# Patient Record
Sex: Female | Born: 1935 | Race: White | Hispanic: No | Marital: Married
Health system: Southern US, Community
[De-identification: ages and names within clinical notes are randomized; demographics above are authoritative.]

---

## 2003-09-30 ENCOUNTER — Other Ambulatory Visit: Payer: Self-pay

## 2004-11-17 ENCOUNTER — Ambulatory Visit: Payer: Self-pay | Admitting: Internal Medicine

## 2005-01-11 ENCOUNTER — Emergency Department: Payer: Self-pay | Admitting: Emergency Medicine

## 2005-01-18 ENCOUNTER — Ambulatory Visit: Payer: Self-pay | Admitting: Internal Medicine

## 2005-01-25 ENCOUNTER — Ambulatory Visit: Payer: Self-pay | Admitting: Internal Medicine

## 2005-05-31 ENCOUNTER — Encounter: Payer: Self-pay | Admitting: Specialist

## 2005-06-10 ENCOUNTER — Encounter: Payer: Self-pay | Admitting: Specialist

## 2005-07-10 ENCOUNTER — Encounter: Payer: Self-pay | Admitting: Specialist

## 2005-11-19 ENCOUNTER — Ambulatory Visit: Payer: Self-pay | Admitting: Internal Medicine

## 2006-07-01 ENCOUNTER — Ambulatory Visit: Payer: Self-pay | Admitting: Ophthalmology

## 2006-07-08 ENCOUNTER — Ambulatory Visit: Payer: Self-pay | Admitting: Ophthalmology

## 2006-08-29 IMAGING — CT CT PELVIS W/O CM
1 series · 15 of 32 positions shown, 19 images · non-contrast
Comparison: none

REASON FOR EXAM: Increased tracer activity right sacroiliac joint
COMMENTS:

[Series 3: inspace · axial · 0.66mm/px · z∈[+405,+604]mm · 15 of 441 slices shown, 19 images]
[im 29/441  soft-tissue]
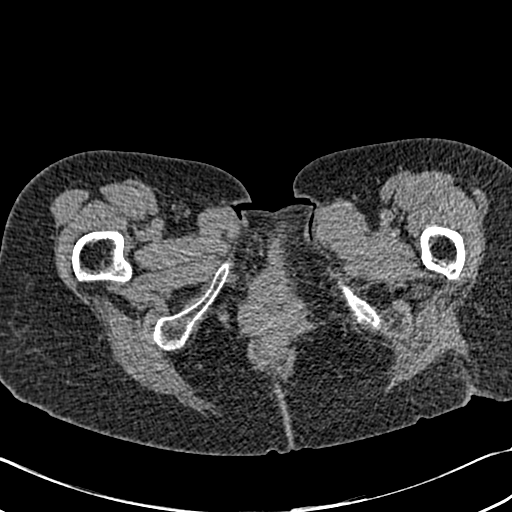
[im 29/441  bone]
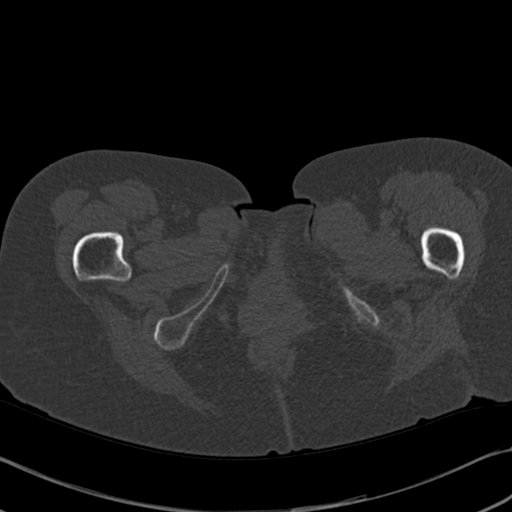
[im 57/441  soft-tissue]
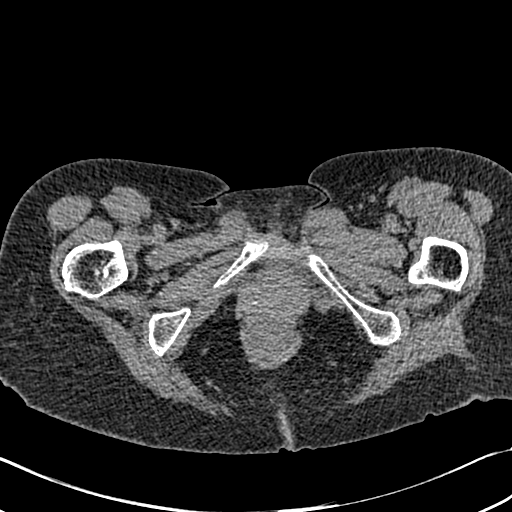
[im 86/441  soft-tissue]
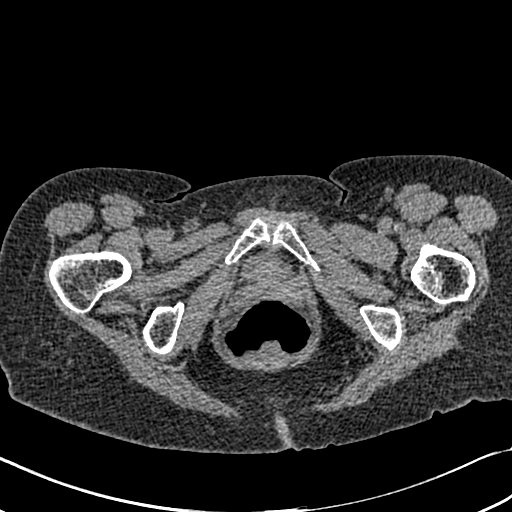
[im 128/441  soft-tissue]
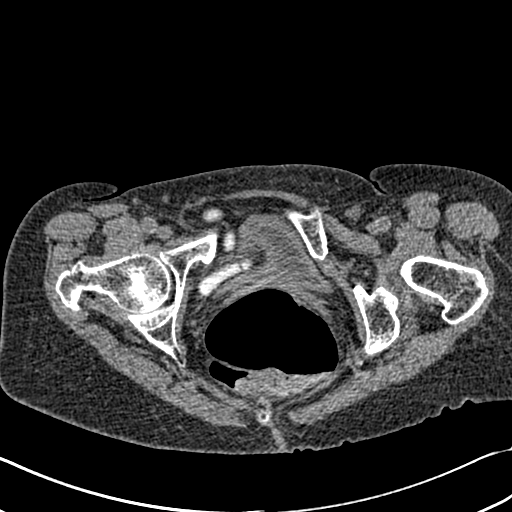
[im 157/441  soft-tissue]
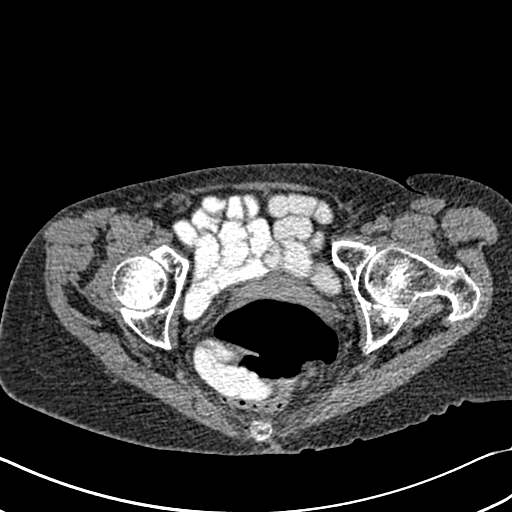
[im 185/441  soft-tissue]
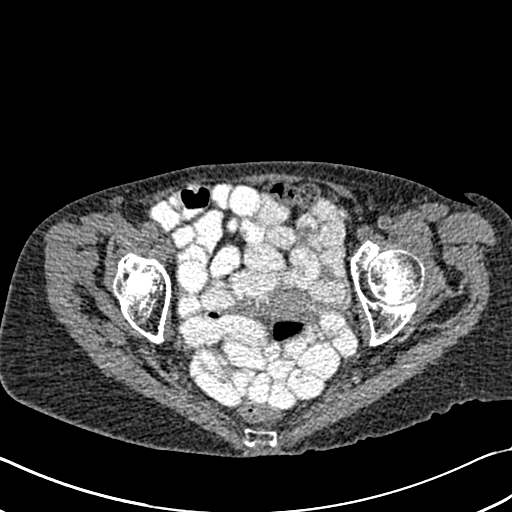
[im 228/441  soft-tissue]
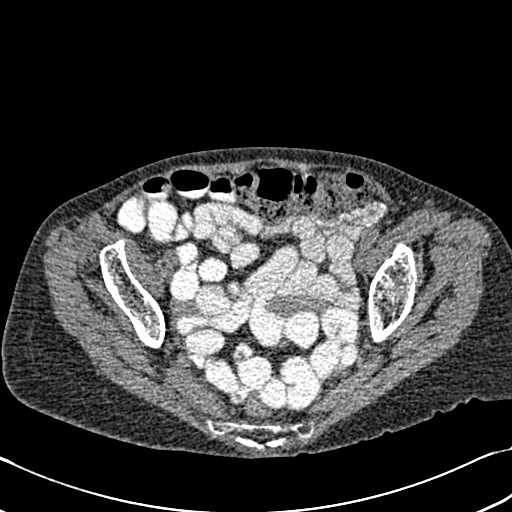
[im 256/441  soft-tissue]
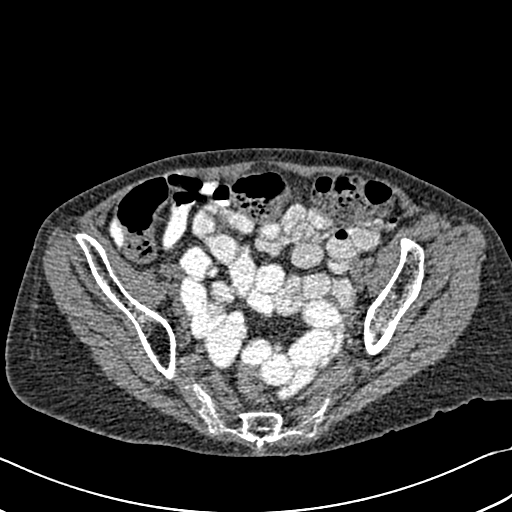
[im 284/441  soft-tissue]
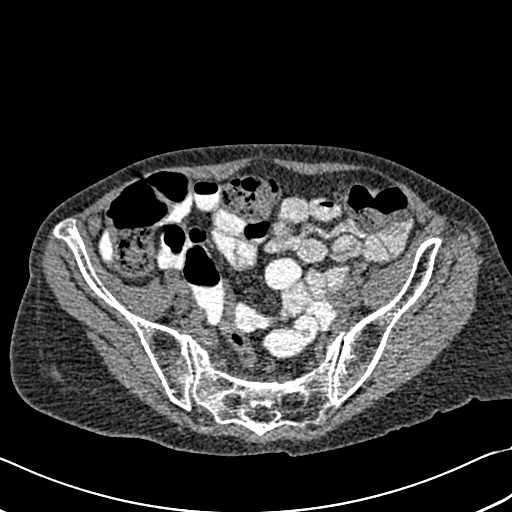
[im 284/441  bone]
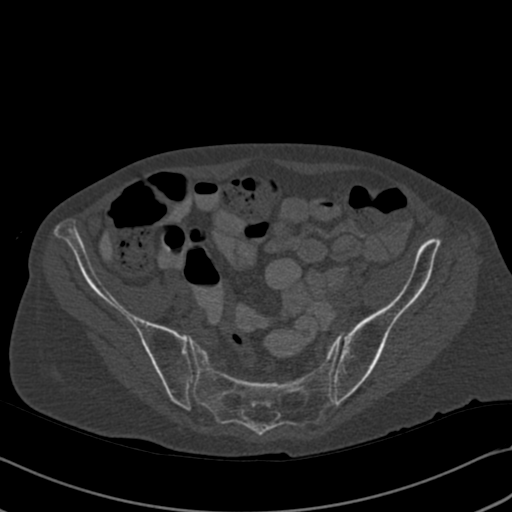
[im 313/441  soft-tissue]
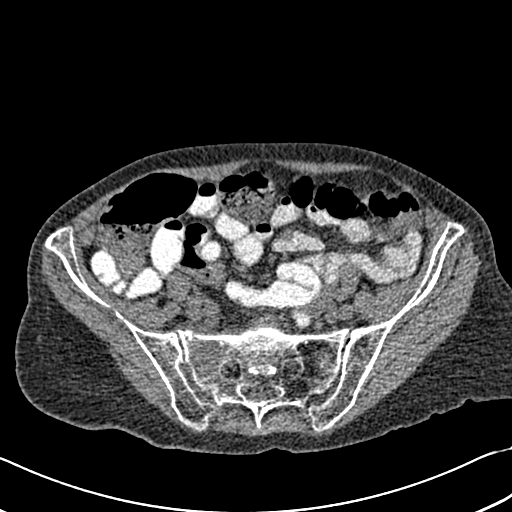
[im 355/441  soft-tissue]
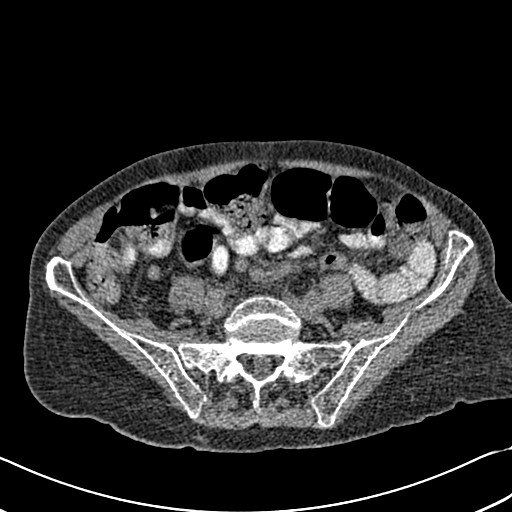
[im 384/441  soft-tissue]
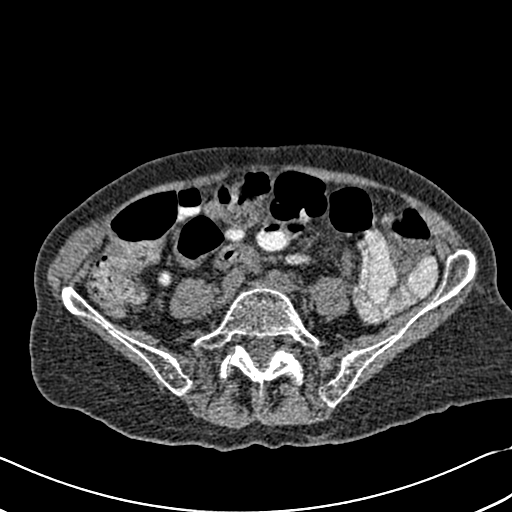
[im 384/441  lung]
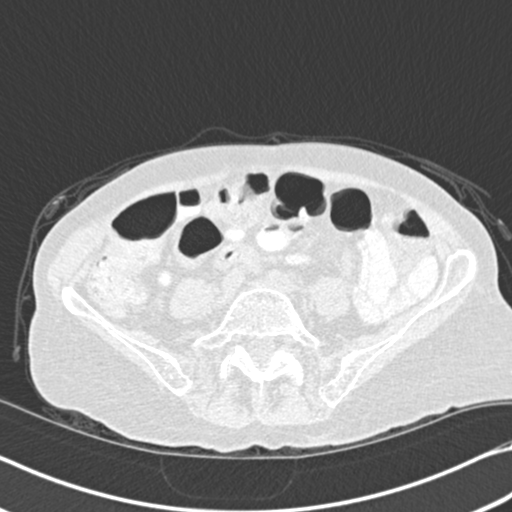
[im 398/441  lung]
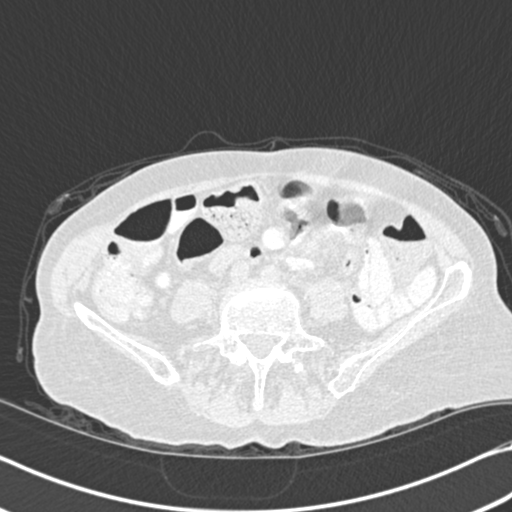
[im 412/441  soft-tissue]
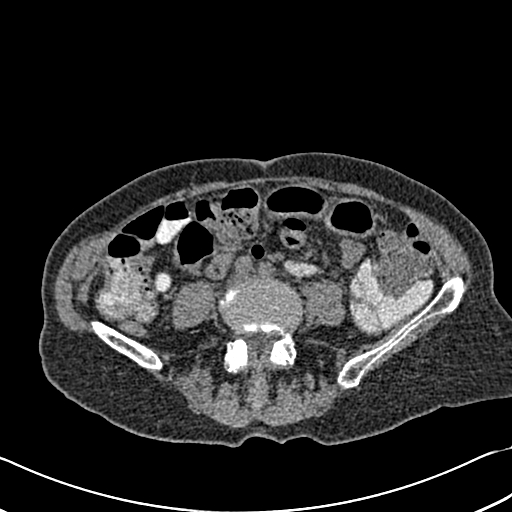
[im 412/441  lung]
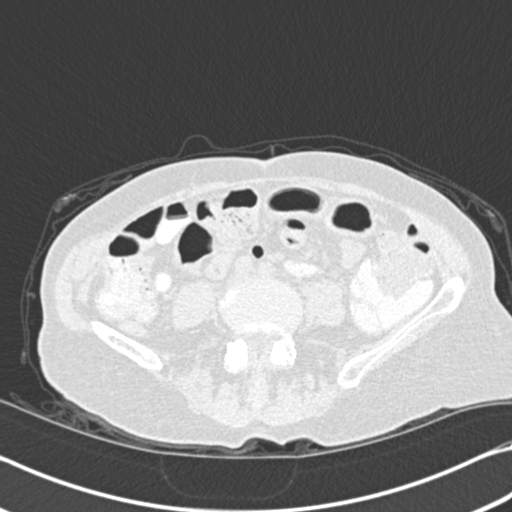
[im 426/441  lung]
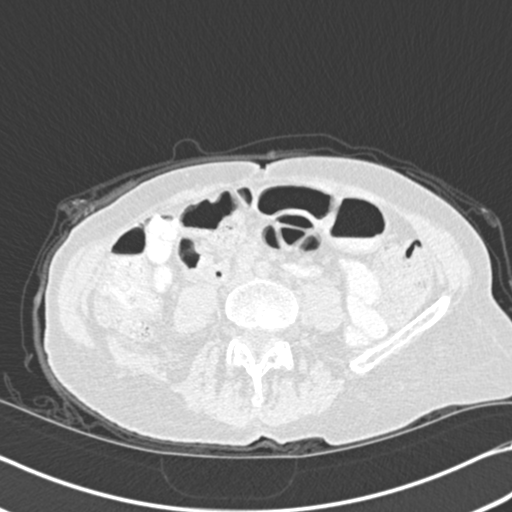

[15 of 32 positions shown; findings below may reference images not displayed]

PROCEDURE:     CT  - CT PELVIS STANDARD WO  - January 25, 2005  [DATE]

RESULT:     The patient underwent recent Nuclear Bone Scan which revealed
abnormally increased uptake in the RIGHT SI joint.

Multiplanar images were interpreted.

There is a fracture through the periphery of the RIGHT sacral ala. Some
comminution is seen. The fracture extends from the superior surface of the
sacral ala inferiorly and medially. The LEFT sacral ala appears intact. The
body of the sacrum is grossly intact. The superior and inferior pubic rami
appear intact.

The observed portions of the hips are normal in appearance. I do not see
significant hematoma adjacent to the sacral alar fracture on the RIGHT.
There are mild degenerative facet joint changes in the visualized portions
of the lower lumbar spine.
IMPRESSION: 1.     There is evidence of a fracture involving the periphery of the RIGHT
sacral ala. This fracture line likely extends at least in part to the SI
joint. The adjacent iliac portion of the SI joint appears normal. I do not
see evidence of a fracture involving the LEFT sacral ala nor is there a
fracture visible elsewhere in the bony pelvic ring.
2.     There are mild degenerative changes of the facet joints in the lower
lumbar spine.
3.     There is evidence of osteopenia diffusely.
4.     Please note there is very subtle heterogeneous mineralization and
slight irregularity of the cortical surface with some sclerosis of the
underlying medullary bone in the paramedian portion of the RIGHT pubic bone
near the symphysis. This may reflect a second component of this unexpected
pelvic fracture. This area could not be clearly discerned as abnormal on the
prior Nuclear Bone Scan.

## 2006-11-26 ENCOUNTER — Ambulatory Visit: Payer: Self-pay | Admitting: Internal Medicine

## 2007-10-09 ENCOUNTER — Other Ambulatory Visit: Payer: Self-pay

## 2007-10-09 ENCOUNTER — Inpatient Hospital Stay: Payer: Self-pay | Admitting: Internal Medicine

## 2007-12-18 ENCOUNTER — Ambulatory Visit: Payer: Self-pay | Admitting: Internal Medicine

## 2007-12-30 ENCOUNTER — Ambulatory Visit: Payer: Self-pay | Admitting: Internal Medicine

## 2008-01-11 ENCOUNTER — Emergency Department: Payer: Self-pay | Admitting: Emergency Medicine

## 2008-07-14 ENCOUNTER — Emergency Department: Payer: Self-pay | Admitting: Emergency Medicine

## 2008-09-09 ENCOUNTER — Ambulatory Visit: Payer: Self-pay | Admitting: Internal Medicine

## 2009-01-04 ENCOUNTER — Ambulatory Visit: Payer: Self-pay | Admitting: Internal Medicine

## 2009-01-12 ENCOUNTER — Ambulatory Visit: Payer: Self-pay | Admitting: Internal Medicine

## 2009-08-02 IMAGING — US ABDOMEN ULTRASOUND
1 series · 17 of 25 positions shown · non-contrast
Comparison: none

REASON FOR EXAM: ascites
COMMENTS:

[Series 1: abdomen ultrasound · 17 of 61 slices shown]
[im 1/61]
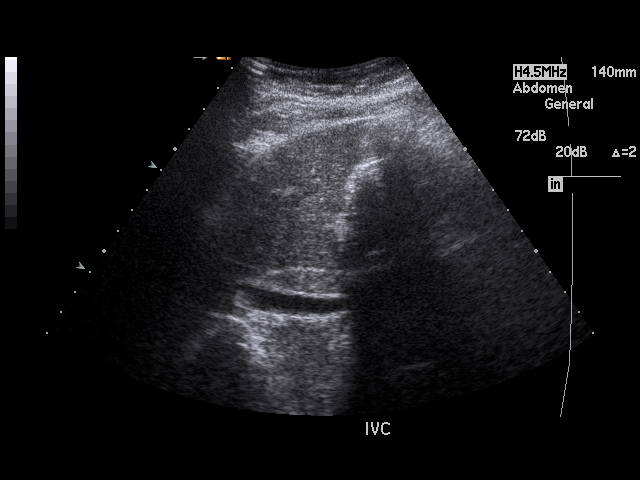
[im 6/61]
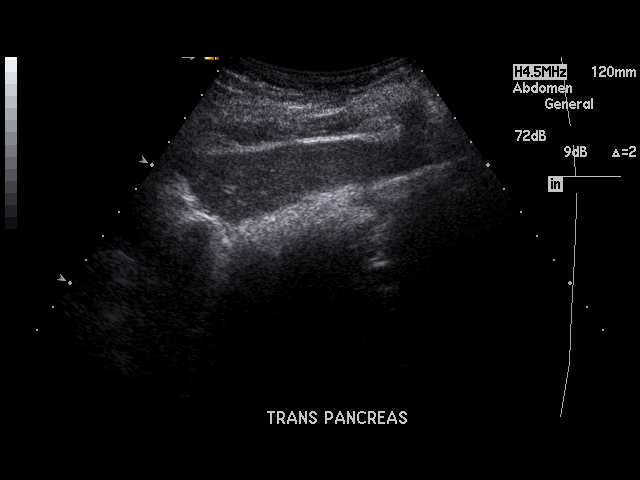
[im 8/61]
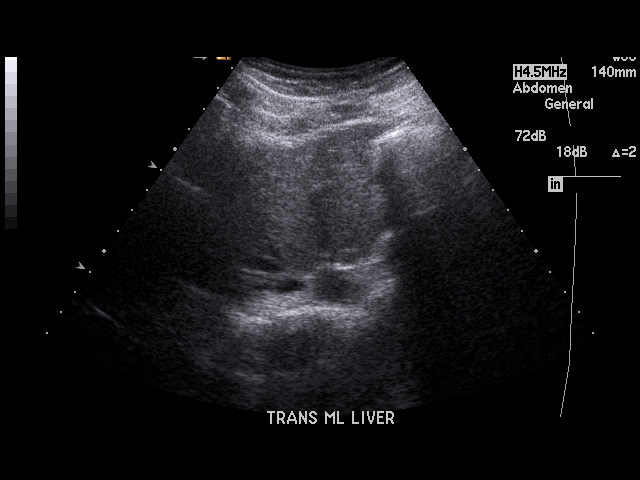
[im 13/61]
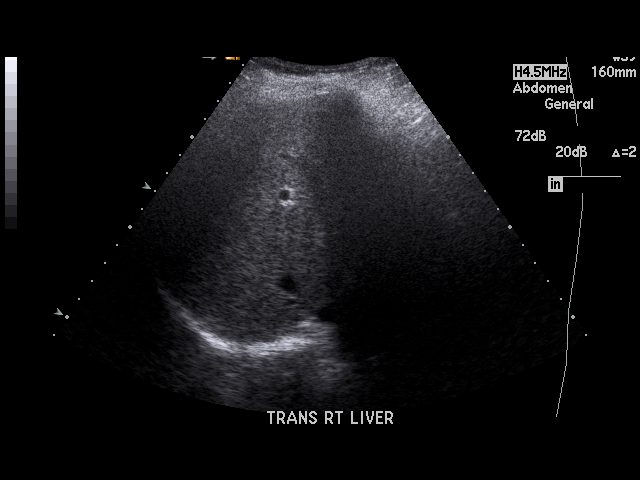
[im 16/61]
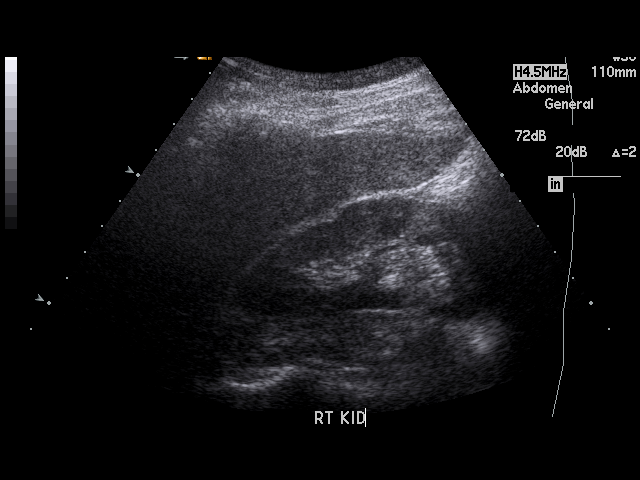
[im 21/61]
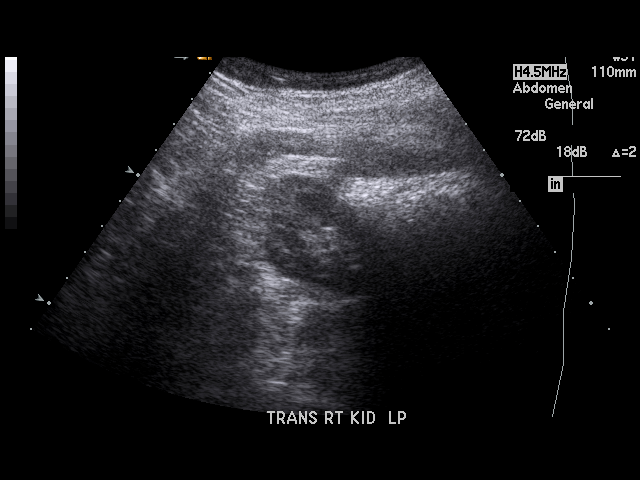
[im 23/61]
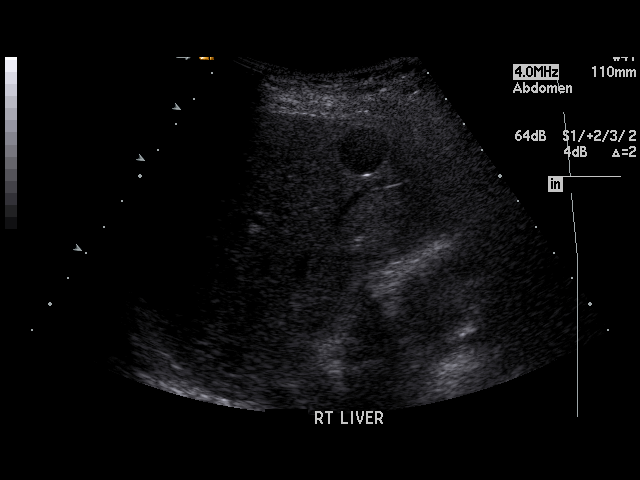
[im 28/61]
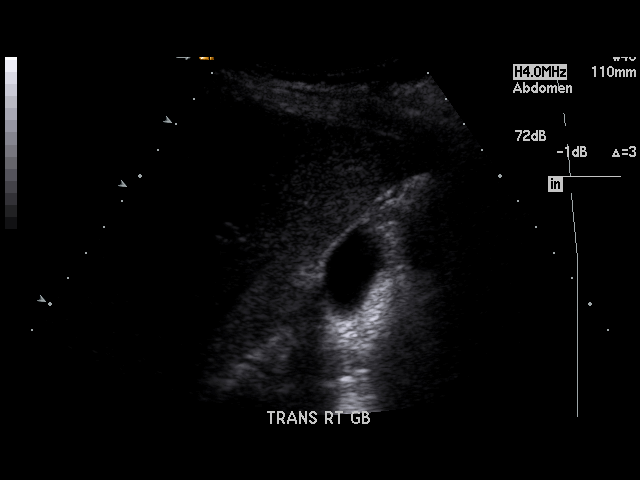
[im 31/61]
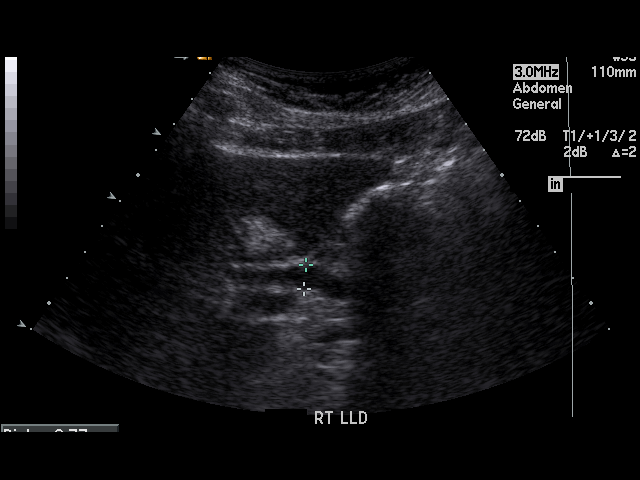
[im 33/61]
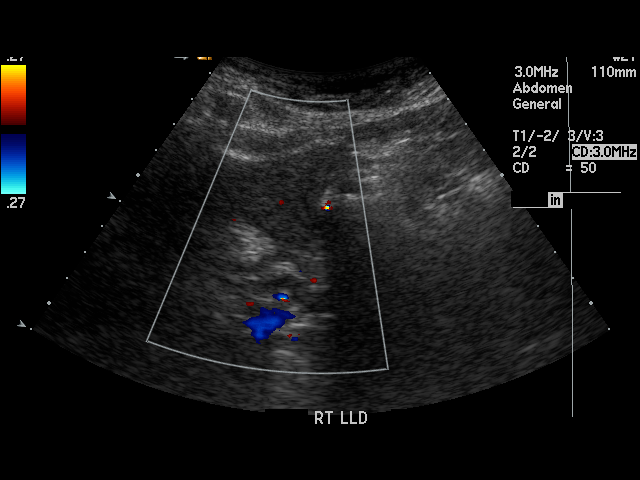
[im 38/61]
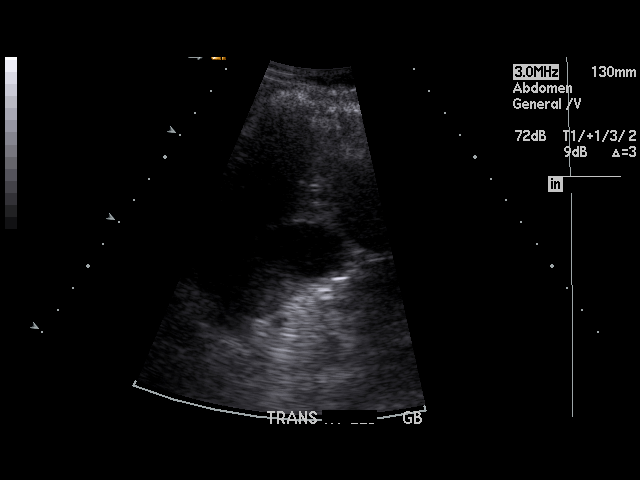
[im 41/61]
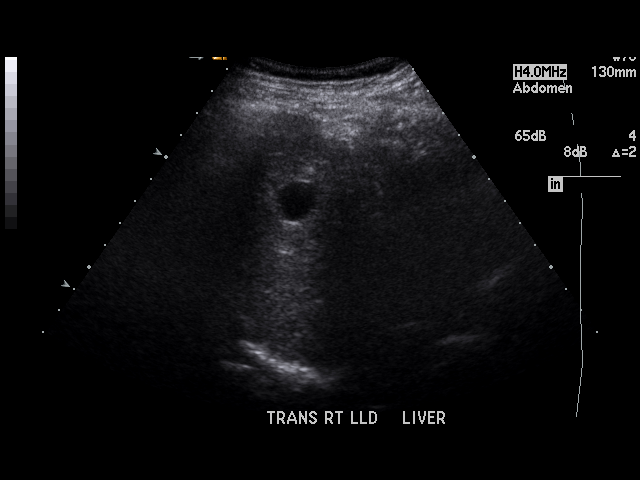
[im 46/61]
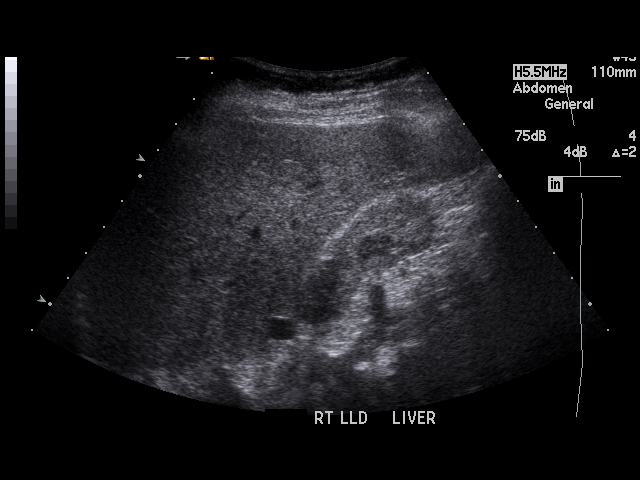
[im 48/61]
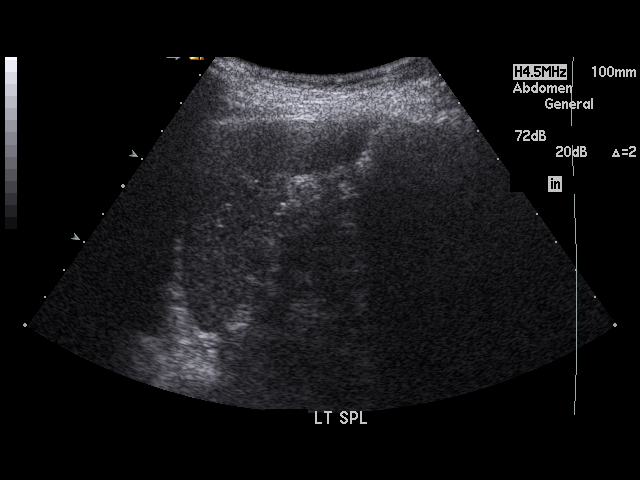
[im 53/61]
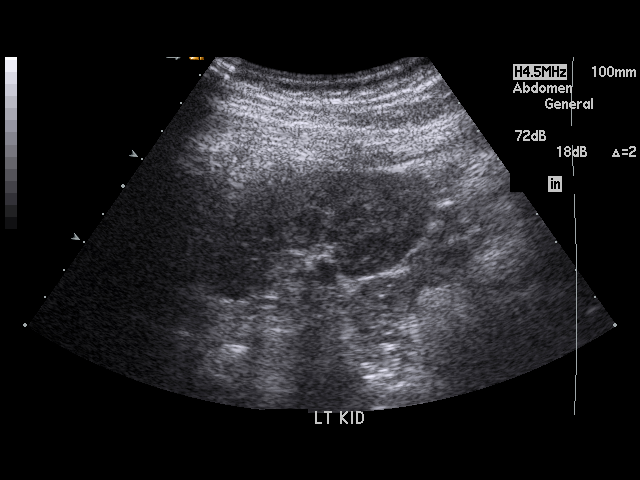
[im 56/61]
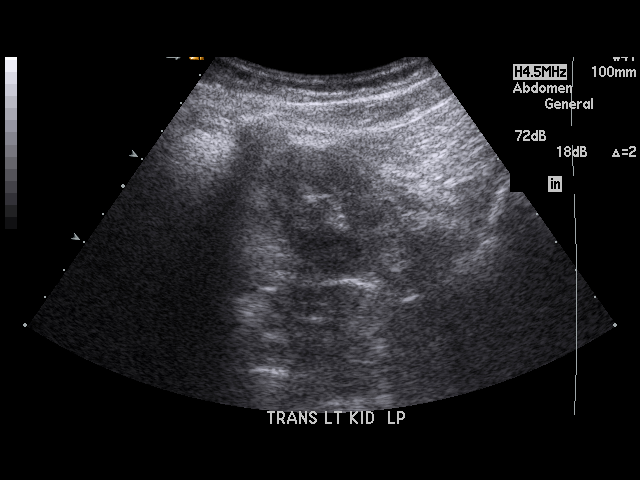
[im 61/61]
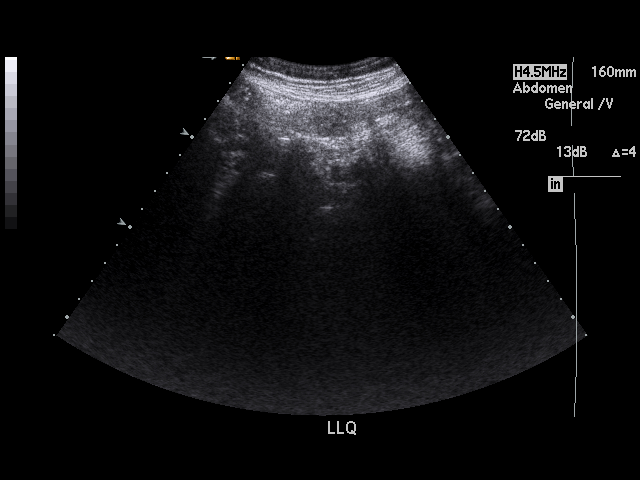

[17 of 25 positions shown; findings below may reference images not displayed]

PROCEDURE:     US  - US ABDOMEN GENERAL SURVEY  - December 30, 2007  [DATE]

RESULT:     There are two cysts the RIGHT of the liver. The larger measures
1.73 cm at maximum diameter and the smaller measures 1.33 cm at maximum
diameter. The hepatic echo pattern otherwise is normal appearance. The
pancreas is not visualized adequately for evaluation on this exam. Spleen
size is normal. The abdominal aorta inferior and vena cava show no
significant abnormalities. No gallstones are seen. There is no thickening of
the gallbladder wall. The common bile duct measures 7.7 mm in diameter which
is prominent but may be normal for age. No dilated intrahepatic ducts are
seen. No ascites is identified. The kidneys show no hydronephrosis.
IMPRESSION: 1. There are noted two hepatic cysts as mentioned above.
2. No ascites is seen.
3. No gallstones are noted.
4. The pancreas is not visualized adequately for evaluation on this exam.

## 2009-11-16 ENCOUNTER — Ambulatory Visit: Payer: Self-pay | Admitting: Unknown Physician Specialty

## 2009-11-28 ENCOUNTER — Encounter: Payer: Self-pay | Admitting: Internal Medicine

## 2009-12-10 ENCOUNTER — Encounter: Payer: Self-pay | Admitting: Internal Medicine

## 2010-02-15 ENCOUNTER — Ambulatory Visit: Payer: Self-pay | Admitting: Internal Medicine

## 2010-04-05 ENCOUNTER — Emergency Department: Payer: Self-pay | Admitting: Emergency Medicine

## 2010-05-21 ENCOUNTER — Observation Stay: Payer: Self-pay | Admitting: Family Medicine

## 2010-05-25 ENCOUNTER — Encounter: Payer: Self-pay | Admitting: Internal Medicine

## 2010-06-11 ENCOUNTER — Encounter: Payer: Self-pay | Admitting: Internal Medicine

## 2010-07-11 ENCOUNTER — Encounter: Payer: Self-pay | Admitting: Internal Medicine

## 2012-05-04 ENCOUNTER — Inpatient Hospital Stay: Payer: Self-pay | Admitting: Family Medicine

## 2012-05-04 LAB — CBC
HGB: 14.1 g/dL (ref 12.0–16.0)
MCH: 30.6 pg (ref 26.0–34.0)
MCHC: 32.9 g/dL (ref 32.0–36.0)
MCV: 93 fL (ref 80–100)
Platelet: 292 10*3/uL (ref 150–440)
RDW: 14.1 % (ref 11.5–14.5)
WBC: 11 10*3/uL (ref 3.6–11.0)

## 2012-05-04 LAB — COMPREHENSIVE METABOLIC PANEL
Albumin: 4.1 g/dL (ref 3.4–5.0)
BUN: 13 mg/dL (ref 7–18)
Bilirubin,Total: 0.4 mg/dL (ref 0.2–1.0)
Calcium, Total: 9.4 mg/dL (ref 8.5–10.1)
Chloride: 96 mmol/L — ABNORMAL LOW (ref 98–107)
Co2: 28 mmol/L (ref 21–32)
EGFR (African American): >60
EGFR (Non-African Amer.): >60
Glucose: 104 mg/dL — ABNORMAL HIGH (ref 65–99)
Osmolality: 259 (ref 275–301)
Potassium: 4.1 mmol/L (ref 3.5–5.1)
SGPT (ALT): 22 U/L (ref 12–78)
Sodium: 129 mmol/L — ABNORMAL LOW (ref 136–145)
Total Protein: 8 g/dL (ref 6.4–8.2)

## 2012-05-04 LAB — URINALYSIS, COMPLETE
Bilirubin,UR: NEGATIVE
Nitrite: NEGATIVE
Ph: 8 (ref 4.5–8.0)
Protein: NEGATIVE
RBC,UR: 4 /HPF (ref 0–5)
Specific Gravity: 1.014 (ref 1.003–1.030)
WBC UR: 47 /HPF (ref 0–5)

## 2012-05-04 LAB — TROPONIN I: Troponin-I: <0.02 ng/mL

## 2012-05-05 LAB — BASIC METABOLIC PANEL
Anion Gap: 8 (ref 7–16)
BUN: 10 mg/dL (ref 7–18)
Calcium, Total: 8.4 mg/dL — ABNORMAL LOW (ref 8.5–10.1)
Co2: 24 mmol/L (ref 21–32)
EGFR (Non-African Amer.): >60
Glucose: 92 mg/dL (ref 65–99)
Potassium: 3.9 mmol/L (ref 3.5–5.1)
Sodium: 134 mmol/L — ABNORMAL LOW (ref 136–145)

## 2012-05-05 LAB — CBC WITH DIFFERENTIAL/PLATELET
Basophil #: 0 10*3/uL (ref 0.0–0.1)
Basophil %: 0.8 %
Eosinophil %: 1.1 %
HGB: 11.3 g/dL — ABNORMAL LOW (ref 12.0–16.0)
Lymphocyte #: 1.2 10*3/uL (ref 1.0–3.6)
Lymphocyte %: 22.3 %
Monocyte %: 10.7 %
RBC: 3.61 10*6/uL — ABNORMAL LOW (ref 3.80–5.20)
RDW: 14.2 % (ref 11.5–14.5)
WBC: 5.5 10*3/uL (ref 3.6–11.0)

## 2012-05-05 LAB — MAGNESIUM: Magnesium: 1.7 mg/dL — ABNORMAL LOW

## 2012-05-06 LAB — URINE CULTURE

## 2012-10-09 ENCOUNTER — Emergency Department: Payer: Self-pay | Admitting: Internal Medicine

## 2012-10-09 LAB — COMPREHENSIVE METABOLIC PANEL
Albumin: 3.6 g/dL (ref 3.4–5.0)
Alkaline Phosphatase: 72 U/L (ref 50–136)
Co2: 29 mmol/L (ref 21–32)
Creatinine: 0.84 mg/dL (ref 0.60–1.30)
EGFR (African American): >60
Glucose: 106 mg/dL — ABNORMAL HIGH (ref 65–99)
SGPT (ALT): 20 U/L (ref 12–78)
Sodium: 128 mmol/L — ABNORMAL LOW (ref 136–145)
Total Protein: 7.4 g/dL (ref 6.4–8.2)

## 2012-10-09 LAB — URINALYSIS, COMPLETE
Glucose,UR: NEGATIVE mg/dL (ref 0–75)
Ketone: NEGATIVE
Ph: 9 (ref 4.5–8.0)
RBC,UR: 53 /HPF (ref 0–5)
Squamous Epithelial: NONE SEEN

## 2012-10-09 LAB — CBC
MCV: 90 fL (ref 80–100)
Platelet: 252 10*3/uL (ref 150–440)
RDW: 13.9 % (ref 11.5–14.5)
WBC: 10.8 10*3/uL (ref 3.6–11.0)

## 2012-10-09 LAB — TROPONIN I: Troponin-I: <0.02 ng/mL

## 2014-07-02 NOTE — Discharge Summary (Signed)
PATIENT NAME:  Sheila DeedsMONTGOMERY, [name] MR#:  [id number] DATE OF BIRTH:  [birth year]  DATE OF ADMISSION:  05/04/2012 DATE OF DISCHARGE:  05/06/2012  DISCHARGE DIAGNOSES: 1.  Vasovagal episode.  2.  Urinary tract infection.  3.  Hyperlipidemia.  4.  Moderate dementia.  5.  History of transient ischemic attack, on Plavix.  6.  Chronic constipation.   DISCHARGE MEDICATIONS: 1.  Atorvastatin 10 mg p.o. once a day.  2.  Plavix 75 mg p.o. daily.  3.  Senna + 50 mg/8.6 mg 1 tab p.o. b.i.d.  4.  Namenda 5 mg p.o. b.i.d.  5.  Vitamin D3 1000 international units 1 daily.  6.  Donepezil 10 mg p.o. at bedtime.  7.  Pantoprazole 40 mg p.o. daily.  8.  Bactrim DS 1 tab p.o. b.i.d. x 2 more days.   CONSULTS: None.   PROCEDURES: None.   PERTINENT LABORATORY DATA DURING THE HOSPITALIZATION:  Sodium was 134, potassium 3.9, creatinine 0.65, glucose 92.  White blood cell count 5.5, hemoglobin 11.3 and platelets 235. Urinalysis showed 2+ leukocyte esterase, urine culture did not convey much, it showed colonies were too small to read.  BRIEF HOSPITAL COURSE:   PROBLEM #1:  Vasovagal episode. The patient was initially admitted because she had a vasovagal episode in the Emergency Room.  With her history of TIA, she was admitted for observation.  She had no further issues. She was evaluated by physical therapy and had no further syncopal episodes.  Did recommend home health, physical therapy and 24-hour  observation for the first few days at home. We will set all of this up for her prior to discharge.   Other chronic medical issues remained stable. No changes to those regimens.   DISPOSITION: She is in stable condition and will be discharged to home with 24-hour supervision for 3 days with home health physical therapy for instability. She will follow up with Dr. Burnadette PopLinthavong in 1 to 2 weeks.     ____________________________ Marisue IvanKanhka Dajah Fischman, MD kl:ct D: 05/06/2012 08:02:00 ET T: 05/06/2012 11:18:46  ET JOB#: [id number]  cc: Marisue IvanKanhka Avish Torry, MD, <Dictator> Marisue IvanKANHKA Dangela How MD ELECTRONICALLY SIGNED 05/06/2012 14:21

## 2014-07-02 NOTE — H&P (Signed)
PATIENT NAME:  Sheila DeedsMONTGOMERY, Norissa J MR#:  [id number] DATE OF BIRTH:  1923/10/18  DATE OF ADMISSION:  05/04/2012  Addendum  HOME MEDICATIONS:  Atorvastatin 10 mg p.o. in the morning once a day, Plavix 75 mg p.o. daily, donepezil 10 mg p.o. at bedtime, Namenda 5 mg p.o. p.o. b.i.d., Senna Plus 50/8.6 mg p.o. tablets 1 tab b.i.d., vitamin D3 at 1000 International Units capsules 1 cap once a day.    ____________________________ Shaune PollackQing Zeshan Sena, MD qc:si D: 05/04/2012 15:59:21 ET T: 05/04/2012 16:42:24 ET JOB#: [id number]  cc: Shaune PollackQing Roseana Rhine, MD, <Dictator> Shaune PollackQING Laszlo Ellerby MD ELECTRONICALLY SIGNED 05/05/2012 16:28

## 2014-07-02 NOTE — H&P (Signed)
PATIENT NAME:  Sheila DeedsMONTGOMERY, Sheila J MR#:  [id number] DATE OF BIRTH:  1923-08-21  DATE OF ADMISSION:  05/04/2012  PRIMARY CARE PHYSICIAN:  Marisue IvanKanhka Linthavong, MD  REFERRING PHYSICIAN:  Dr. Pryor MontesKeener  CHIEF COMPLAINT:  Passed out today.   HISTORY OF PRESENT ILLNESS:  The patient is an 81 year old Caucasian female with a history of hypertension, hyperlipidemia, TIA and diverticulitis who presented to the ED after passing out today. The patient is awake, alert and has mild dementia. According to the patient and the patient's husband, the patient passed out today. She almost fell, but was held by her husband. The patient said she lost consciousness. Feels weak with chills, but denies any fever, headache or dizziness. She denies any cough, sputum, shortness of breath. No chest pain, palpitation. The patient's husband says the patient has chronic incontinence. Recently had very bad urine smelling. Urinalysis in the ED showed UTI.   PAST MEDICAL HISTORY:  TIA, diverticulitis, hypertension, hyperlipidemia, GERD.   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   SOCIAL HISTORY:  No smoking or drinking or illicit drugs.   FAMILY HISTORY:  Mother and sister has stroke.   ALLERGIES:  AMOXICILLIN, CIPRO, CODEINE, DARVOCET, ILOSONE, KEFLEX, PENICILLIN, ULTRACET.   HOME MEDICATIONS:  Plavix 75 mg p.o. daily.   REVIEW OF SYSTEMS:  CONSTITUTIONAL: The patient denies any fever or chills. No headache or dizziness, but has generalized weakness.  EYES: No double vision, blurred vision.  ENT: No postnasal drip, slurred speech or dysphagia.  CARDIOVASCULAR: No chest pain, palpitation, orthopnea or nocturnal dyspnea. No leg edema.  PULMONARY: No cough, sputum, shortness of breath or hematemesis.  GASTROINTESTINAL: No abdominal pain, nausea, vomiting, or diarrhea. No melena or bloody stool.  GENITOURINARY: No dysuria  or hematuria, but has chronic incontinence.  SKIN: No rash or jaundice.  HEMATOLOGY: No easy bruising or bleeding.  ENDOCRINE: No polyuria, polydipsia, heat or cold intolerance.  NEUROLOGY: Positive for syncope and loss of consciousness, but no seizure.   PHYSICAL EXAMINATION: VITAL SIGNS: Blood pressure 160/86, pulse 68, oxygen saturation 96% on room air.  GENERAL: The patient is alert, awake, oriented, but has mild dementia in no acute distress.  HEENT: Pupils round, equal, reactive to light and accommodation. Moist oral mucosa. Clear oropharynx.  NECK: Supple. No JVD or carotid bruit. No lymphadenopathy. No thyromegaly.  CARDIOVASCULAR: S1, S2 regular rate, rhythm. No murmurs or gallops.  PULMONARY: Bilateral air entry. No wheezing or rales. No use of accessory muscles to breathe.  ABDOMEN: Soft. No distention or tenderness. No organomegaly. Bowel sounds present.  EXTREMITIES: No edema, clubbing or cyanosis. No calf tenderness. Strong bilateral pedal pulses.  SKIN: No rash or jaundice.  NEUROLOGIC: Alert and oriented x 3. No focal deficits Power 5 out of 5. Sensation is intact.   DIAGNOSTIC DATA:  WBC 11, hemoglobin 14.1, platelets 292. Glucose is 104, BUN 13, creatinine 0.78, sodium 129, potassium 4.1, chloride 96, bicarbonate 28. Troponin is less than 0.02. Chest x-ray showed findings consistent with underlying COPD. Urinalysis showed WBC 47 and RBC 4. EKG shows sinus rhythm with PVC at 65 beats per minute.   IMPRESSION:   1.  Syncope possibly due to a urinary tract infection and mild dehydration.  2.  Urinary tract infection.  3.  Hyponatremia.  4.  Hypertension.  5.  Hyperlipidemia.  6.  Dementia.   PLAN OF TREATMENT:   1.  The patient will be  admitted to medical floor. The patient has a UTI, but is allergic to many antibiotics. The patient has a previous E. coli UTI. Urine culture shows sensitive to sulfa. We will give her Bactrim DS b.i.d. and follow up a urine culture.  2.   For hyponatremia, we will give normal saline IV and follow up BMP.  3.  The patient has hypertension, but is not on medication. We will monitor her blood pressure.   I discussed the patient's condition and plan of treatment with the patient and the patient's husband. I also discussed the patient's CODE STATUS. The patient does want CPR, but does not want any intubation or mechanical ventilation.   TIME SPENT:  About 58 minutes.    ____________________________ Shaune Pollack, MD qc:si D: 05/04/2012 15:25:00 ET T: 05/04/2012 16:09:28 ET JOB#: [id number]  cc: Shaune Pollack, MD, <Dictator> Shaune Pollack MD ELECTRONICALLY SIGNED 05/05/2012 16:27

## 2019-05-11 DEATH — deceased
